# Patient Record
Sex: Female | Born: 1980 | Hispanic: Yes
Health system: Southern US, Community
[De-identification: ages and names within clinical notes are randomized; demographics above are authoritative.]

## PROBLEM LIST (undated history)

## (undated) DIAGNOSIS — R42 Dizziness and giddiness: Secondary | ICD-10-CM

## (undated) DIAGNOSIS — N920 Excessive and frequent menstruation with regular cycle: Secondary | ICD-10-CM

## (undated) DIAGNOSIS — R079 Chest pain, unspecified: Secondary | ICD-10-CM

## (undated) HISTORY — DX: Dizziness and giddiness: R42

## (undated) HISTORY — DX: Chest pain, unspecified: R07.9

## (undated) HISTORY — DX: Excessive and frequent menstruation with regular cycle: N92.0

---

## 2021-11-19 ENCOUNTER — Other Ambulatory Visit (HOSPITAL_BASED_OUTPATIENT_CLINIC_OR_DEPARTMENT_OTHER): Payer: Self-pay | Admitting: Family Medicine

## 2021-11-19 DIAGNOSIS — Z1231 Encounter for screening mammogram for malignant neoplasm of breast: Secondary | ICD-10-CM

## 2021-11-27 ENCOUNTER — Ambulatory Visit (HOSPITAL_BASED_OUTPATIENT_CLINIC_OR_DEPARTMENT_OTHER): Payer: 59

## 2021-12-14 ENCOUNTER — Ambulatory Visit (HOSPITAL_BASED_OUTPATIENT_CLINIC_OR_DEPARTMENT_OTHER)
Admission: RE | Admit: 2021-12-14 | Discharge: 2021-12-14 | Disposition: A | Discharge status: Home / Self Care | Payer: 59 | Source: Ambulatory Visit | Attending: Family Medicine | Admitting: Family Medicine

## 2021-12-14 ENCOUNTER — Encounter (HOSPITAL_BASED_OUTPATIENT_CLINIC_OR_DEPARTMENT_OTHER): Payer: Self-pay

## 2021-12-14 ENCOUNTER — Other Ambulatory Visit: Payer: Self-pay

## 2021-12-14 DIAGNOSIS — Z1231 Encounter for screening mammogram for malignant neoplasm of breast: Secondary | ICD-10-CM | POA: Insufficient documentation

## 2022-08-25 NOTE — Progress Notes (Signed)
  Cardiology Office Note:    Date:  08/25/2022   ID:  Kelly Diaz, DOB [birth year], MRN [id number]  PCP:  Si Gaul, [place] [facility] Cardiologist:  Lenna Sciara, MD Referring MD: York Pellant, PA-C   Chief Complaint/Reason for Referral: Chest pain  PATIENT DID NOT APPEAR FOR APPOINTMENT   ASSESSMENT:    1. Precordial pain     PLAN:    In order of problems listed above: 1.  Chest pain:                    History of Present Illness:    FOCUSED PROBLEM LIST:   1.  Depression  The patient is a 41 y.o. female with the indicated medical history here for recommendations regarding chest pain.  The patient was seen by her primary care provider recently.  She has had been having frequent chest pain over the last year or so.  It sometimes comes on with exertion and sometimes occurs at rest.  It is stabbing in nature and lasts a few minutes.  It is sometimes associate with palpitations.  She was prescribed Nexium and H. pylori acid was sent by her PCP.  EKG per report demonstrated sinus bradycardia with no acute ischemic changes.          Current Medications: No outpatient medications have been marked as taking for the 08/30/22 encounter (Appointment) with Early Osmond, MD.     Allergies:    Patient has no known allergies.   Social History:       Family Hx: Family History  Problem Relation Age of Onset   Breast cancer Mother 65     Review of Systems:   Please see the history of present illness.    All other systems reviewed and are negative.     EKGs/Labs/Other Test Reviewed:    EKG:  EKG performed today that I personally reviewed demonstrates .  Prior CV studies: None available  Other studies Reviewed: Review of the additional studies/records demonstrates: No CT imaging evidence of atherosclerosis of aorta or coronary calcification  Recent Labs: No results found for requested labs within last 365 days.   Recent  Lipid Panel No results found for: "CHOL", "TRIG", "HDL", "[place]", "LDLDIRECT"  Risk Assessment/Calculations:      Physical Exam:      Signed, Early Osmond, MD  08/25/2022 3:07 PM    [facility] [place], [place], [place]  [address] Phone: [phone]; Fax: [phone]   Note:  This document was prepared using Dragon voice recognition software and may include unintentional dictation errors.

## 2022-08-30 ENCOUNTER — Ambulatory Visit (INDEPENDENT_AMBULATORY_CARE_PROVIDER_SITE_OTHER): Payer: 59 | Admitting: Internal Medicine

## 2022-08-30 DIAGNOSIS — R072 Precordial pain: Secondary | ICD-10-CM

## 2022-09-27 ENCOUNTER — Ambulatory Visit: Payer: Commercial Managed Care - HMO | Attending: Cardiology | Admitting: Cardiology

## 2022-09-27 VITALS — BP 90/70 | HR 57 | Wt 178.0 lb

## 2022-09-27 DIAGNOSIS — R072 Precordial pain: Secondary | ICD-10-CM | POA: Diagnosis not present

## 2022-09-27 NOTE — Patient Instructions (Signed)
Medication Instructions:  The current medical regimen is effective;  continue present plan and medications.  *If you need a refill on your cardiac medications before your next appointment, please call your pharmacy*   Follow-Up: At [facility], you and your health needs are our priority.  As part of our continuing mission to provide you with exceptional heart care, we have created designated Provider Care Teams.  These Care Teams include your primary Cardiologist (physician) and Advanced Practice Providers (APPs -  Physician Assistants and Nurse Practitioners) who all work together to provide you with the care you need, when you need it.  We recommend signing up for the patient portal called "MyChart".  Sign up information is provided on this After Visit Summary.  MyChart is used to connect with patients for Virtual Visits (Telemedicine).  Patients are able to view lab/test results, encounter notes, upcoming appointments, etc.  Non-urgent messages can be sent to your provider as well.   To learn more about what you can do with MyChart, go to [email or web address].    Your next appointment:   Follow up as needed with Dr Skains   Important Information About Sugar       

## 2022-09-27 NOTE — Progress Notes (Signed)
Cardiology Office Note:    Date:  09/27/2022   ID:  Kelly Diaz, DOB Apr 17, 1981, MRN [id number]  PCP:  Si Gaul, [place] [facility] Cardiologist:  None     Referring MD: York Pellant, PA-C     History of Present Illness:    Kelly Diaz is a 41 y.o. female with a hx of hyperlipidemia, obesity, and prediabetes. She is being seen for chest pain management. She was last seen on 07/19/2022 with York Pellant, PA at [place] for chest pain.   She is accompanied with her interpreter. She reported experiencing intermittent chest pain which has been occurring for a year, with 5/10 pain on pain scale.These episodes only last for 2 min with associative shortness of breath that occurs occasionally. Whenever these episodes occur, she drinks water to relieve the discomfort, which she's found helpful.  She denies any tobacco usage or any cardiac family medical history.  She denies any palpitations, or peripheral edema. No lightheadedness, headaches, syncope, orthopnea, or PND.   Past Medical History:  Diagnosis Date   Chest pain    Dizziness    Menorrhagia     No past surgical history on file.  Current Medications:    Allergies:   Patient has no known allergies.   Social History   Socioeconomic History   Marital status: Unknown    Spouse name: Not on file   Number of children: Not on file   Years of education: Not on file   Highest education level: Not on file  Occupational History   Not on file  Tobacco Use   Smoking status: Not on file   Smokeless tobacco: Not on file  Substance and Sexual Activity   Alcohol use: Not on file   Drug use: Not on file   Sexual activity: Not on file  Other Topics Concern   Not on file  Social History Narrative   Not on file   Social Determinants of Health   Financial Resource Strain: Not on file  Food Insecurity: Not on file  Transportation Needs: Not on file  Physical Activity: Not on  file  Stress: Not on file  Social Connections: Not on file     Family History: The patient's family history includes Breast cancer (age of onset: 20) in her mother.  ROS:   Please see the history of present illness.    (+) Shortness of breath (+) Chest pain All other systems reviewed and are negative.  EKGs/Labs/Other Studies Reviewed:    The following studies were reviewed today:   EKG:  EKG is personally reviewed. 09/27/2022: Sinus bradycardia 57 otherwise unremarkable  Recent Labs: No results found for requested labs within last 365 days.  Recent Lipid Panel No results found for: "CHOL", "TRIG", "HDL", "CHOLHDL", "VLDL", "LDLCALC", "LDLDIRECT"   Risk Assessment/Calculations:               Physical Exam:    VS:  BP 90/70 (BP Location: Left Arm, Patient Position: Sitting, Cuff Size: Normal)   Pulse (!) 57   Wt 178 lb (80.7 kg)     Wt Readings from Last 3 Encounters:  09/27/22 178 lb (80.7 kg)     GEN:  Well nourished, well developed in no acute distress HEENT: Normal NECK: No JVD; No carotid bruits LYMPHATICS: No lymphadenopathy CARDIAC: RRR, no murmurs, rubs, gallops RESPIRATORY:  Clear to auscultation without rales, wheezing or rhonchi  ABDOMEN: Soft, non-tender, non-distended MUSCULOSKELETAL:  No edema; No deformity  SKIN: Warm and dry NEUROLOGIC:  Alert and oriented x 3 PSYCHIATRIC:  Normal affect   ASSESSMENT:    1. Precordial pain    PLAN:    In order of problems listed above:  Precordial chest pain - Differential includes cardiac, GERD, musculoskeletal.  She is a non-smoker.  No family history of early CAD.  I offered her coronary CT scan for further evaluation.  At this time, she would like to hold off on further testing.  Her EKG today is reassuring.  I also offered her echocardiogram to ensure proper structure and function of her heart but she would like to hold off.  She will let us know if she would like to pursue further testing in the  future.  All questions answered with help of Spanish interpreter.  She was very Adult nurse.         Follow-up:PRN Medication Adjustments/Labs and Tests Ordered: Current medicines are reviewed at length with the patient today.  Concerns regarding medicines are outlined above.  Orders Placed This Encounter  Procedures   EKG 12-Lead   No orders of the defined types were placed in this encounter.   Patient Instructions  Medication Instructions:  The current medical regimen is effective;  continue present plan and medications.  *If you need a refill on your cardiac medications before your next appointment, please call your pharmacy*  Follow-Up: At [facility], you and your health needs are our priority.  As part of our continuing mission to provide you with exceptional heart care, we have created designated Provider Care Teams.  These Care Teams include your primary Cardiologist (physician) and Advanced Practice Providers (APPs -  Physician Assistants and Nurse Practitioners) who all work together to provide you with the care you need, when you need it.  We recommend signing up for the patient portal called "MyChart".  Sign up information is provided on this After Visit Summary.  MyChart is used to connect with patients for Virtual Visits (Telemedicine).  Patients are able to view lab/test results, encounter notes, upcoming appointments, etc.  Non-urgent messages can be sent to your provider as well.   To learn more about what you can do with MyChart, go to [email or web address].    Your next appointment:   Follow up as needed with Dr Anne Fu.   Important Information About Sugar          I,Danny Valdes,acting as a scribe for [employer], MD.,have documented all relevant documentation on the behalf of Donato Schultz, MD,as directed by  Donato Schultz, MD while in the presence of Donato Schultz, MD.  I, Donato Schultz, MD, have reviewed all documentation for this visit. The  documentation on 09/27/22 for the exam, diagnosis, procedures, and orders are all accurate and complete.   Signed, Donato Schultz, MD  09/27/2022 12:36 PM    [facility]

## 2023-11-15 IMAGING — MG MM DIGITAL SCREENING BILAT W/ TOMO AND CAD
8 series · 8 of 24 positions shown · non-contrast
Comparison: None.

CLINICAL DATA: Screening.

EXAM:
DIGITAL SCREENING BILATERAL MAMMOGRAM WITH TOMOSYNTHESIS AND CAD
TECHNIQUE: Bilateral screening digital craniocaudal and mediolateral oblique
mammograms were obtained. Bilateral screening digital breast
tomosynthesis was performed. The images were evaluated with
computer-aided detection.

[R CC synth-2D]
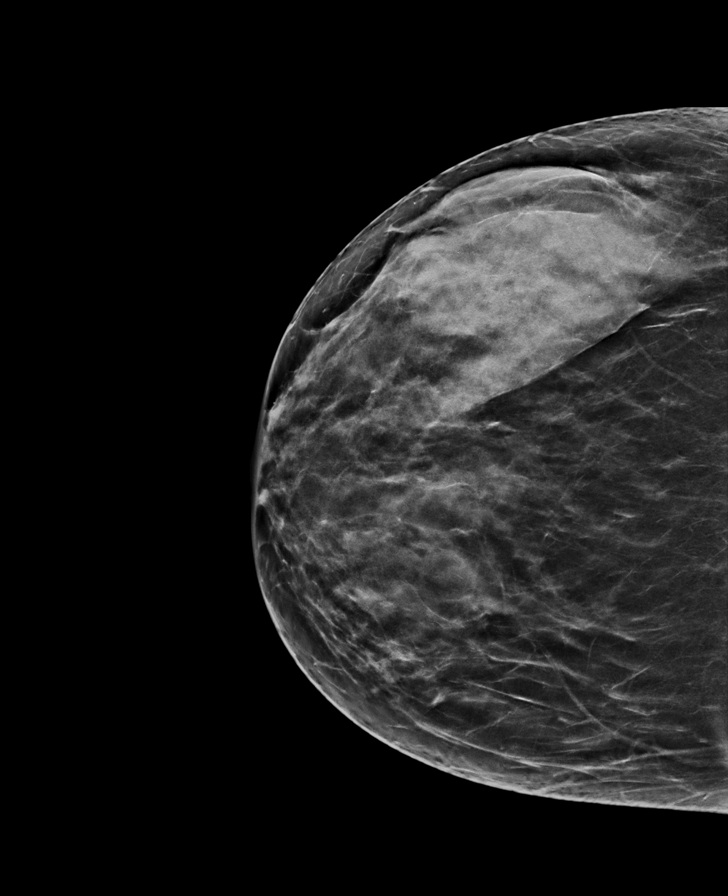

[R MLO synth-2D]
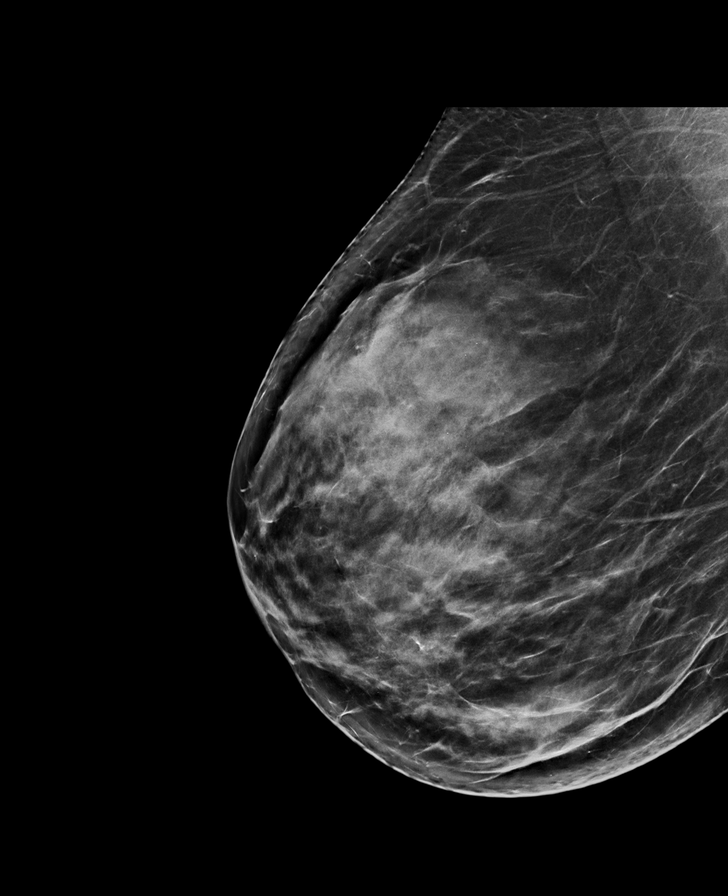

[L CC synth-2D]
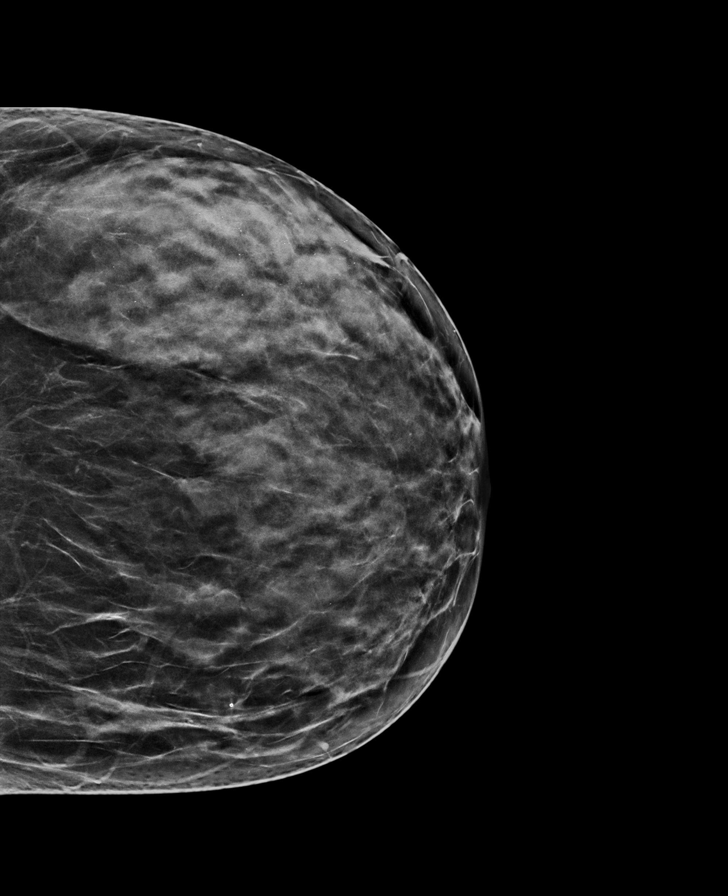

[L MLO synth-2D]
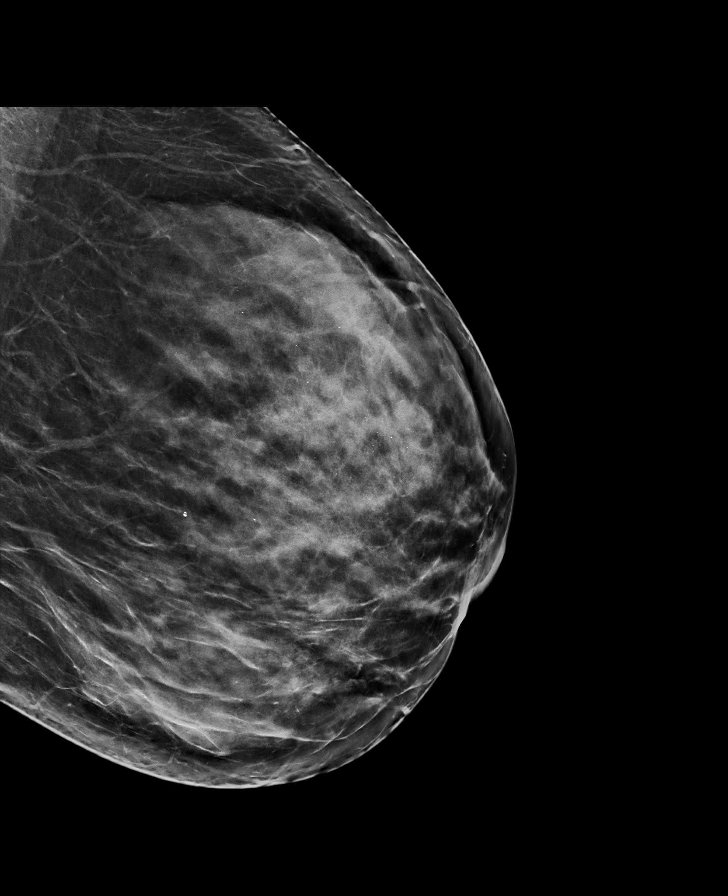

[L MLO tomo · tomo slice 39/76.0]
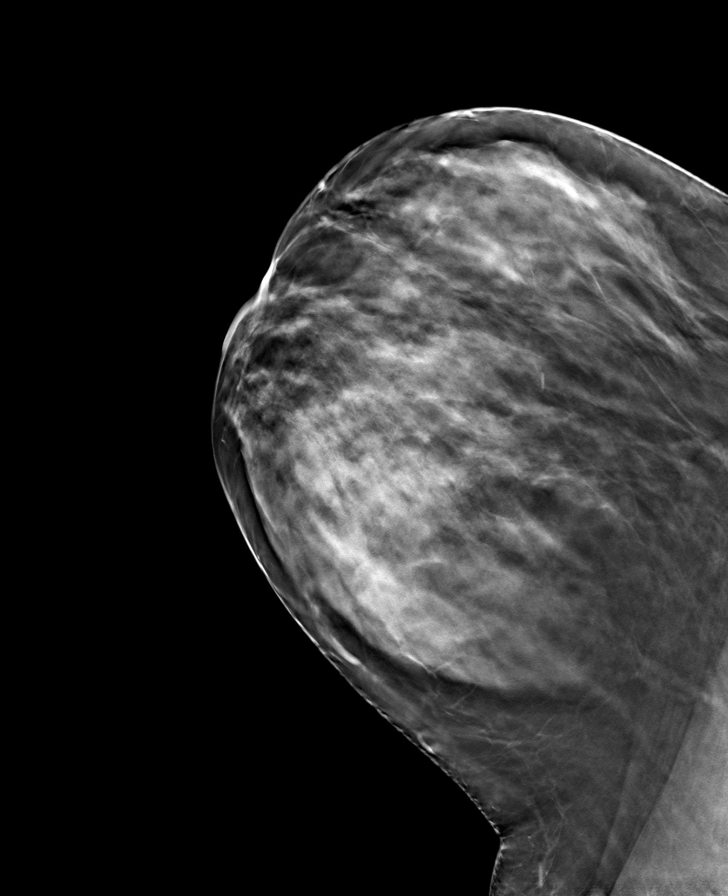

[R MLO tomo · tomo slice 39/78.0]
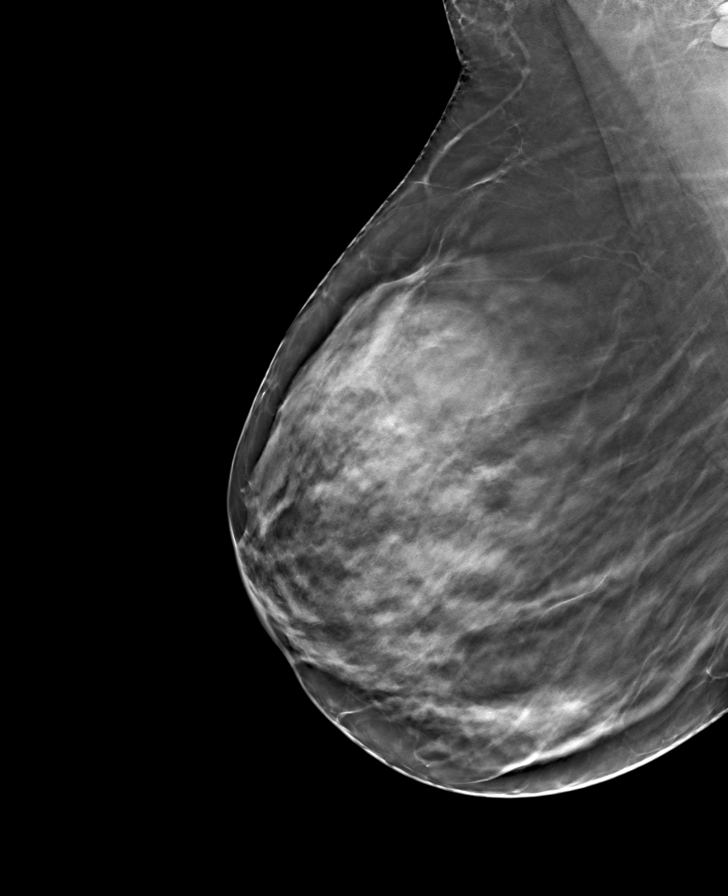

[R CC tomo · tomo slice 35/69.0]
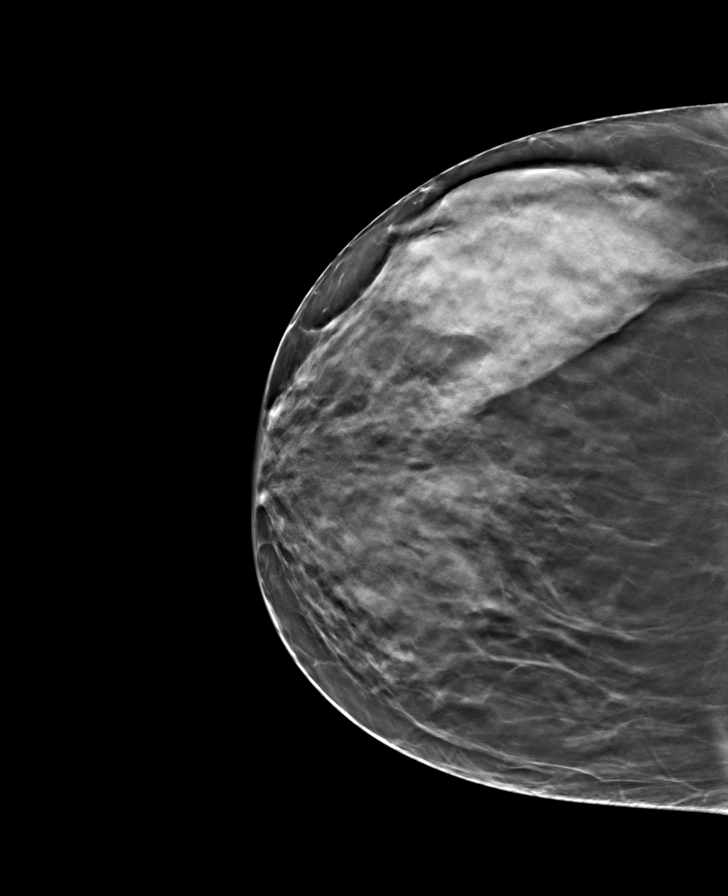

[L CC tomo · tomo slice 36/71.0]
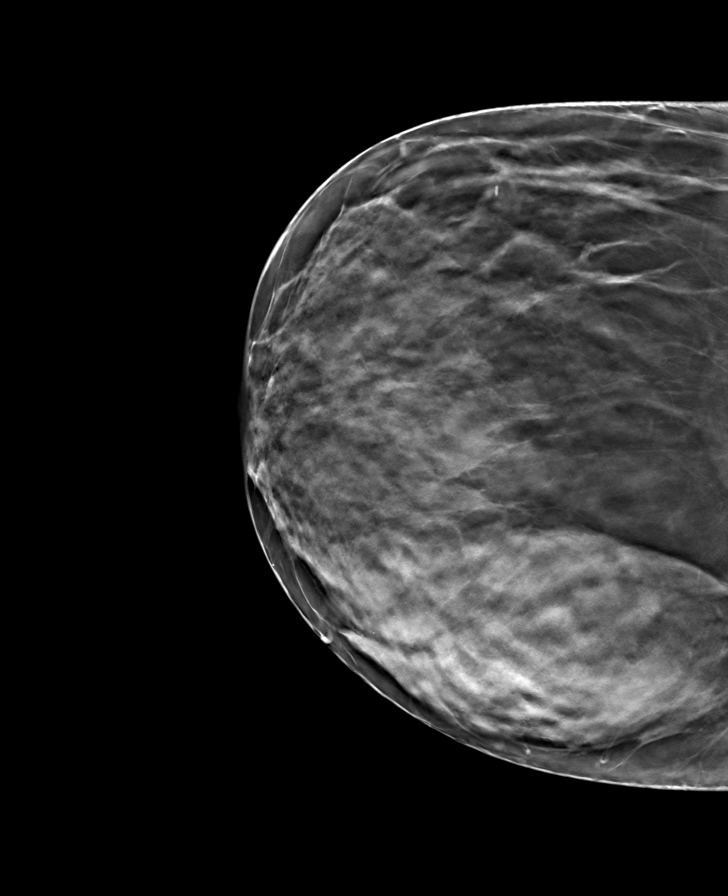

[8 of 24 positions shown; findings below may reference images not displayed]

ACR Breast Density Category c: The breast tissue is heterogeneously
dense, which may obscure small masses
FINDINGS: There are no findings suspicious for malignancy.
IMPRESSION: No mammographic evidence of malignancy. A result letter of this
screening mammogram will be mailed directly to the patient.

RECOMMENDATION:
Screening mammogram in one year. (Code:C8-T-HNK)

BI-RADS CATEGORY  1: Negative.

## 2024-02-17 ENCOUNTER — Other Ambulatory Visit (HOSPITAL_COMMUNITY): Payer: Self-pay
